# Patient Record
Sex: Male | Born: 1985 | Race: White | Hispanic: No | Marital: Single | Smoking: Current every day smoker
Health system: Southern US, Community
[De-identification: ages and names within clinical notes are randomized; demographics above are authoritative.]

## PROBLEM LIST (undated history)

## (undated) DIAGNOSIS — Z8614 Personal history of Methicillin resistant Staphylococcus aureus infection: Secondary | ICD-10-CM

## (undated) DIAGNOSIS — S02609A Fracture of mandible, unspecified, initial encounter for closed fracture: Secondary | ICD-10-CM

## (undated) HISTORY — PX: INTUSSUSCEPTION REPAIR: SHX1847

## (undated) HISTORY — PX: ORIF SHOULDER FRACTURE: SHX5035

---

## 2016-08-27 ENCOUNTER — Emergency Department (HOSPITAL_COMMUNITY): Payer: BLUE CROSS/BLUE SHIELD | Admitting: Anesthesiology

## 2016-08-27 ENCOUNTER — Encounter (HOSPITAL_COMMUNITY): Payer: Self-pay | Admitting: Emergency Medicine

## 2016-08-27 ENCOUNTER — Encounter (HOSPITAL_COMMUNITY)
Admission: EM | Disposition: A | Discharge status: Home / Self Care | Payer: Self-pay | Source: Home / Self Care | Attending: Emergency Medicine

## 2016-08-27 ENCOUNTER — Emergency Department (HOSPITAL_COMMUNITY): Payer: BLUE CROSS/BLUE SHIELD

## 2016-08-27 ENCOUNTER — Ambulatory Visit (HOSPITAL_COMMUNITY)
Admission: EM | Admit: 2016-08-27 | Discharge: 2016-08-28 | Disposition: A | Discharge status: Home / Self Care | Payer: BLUE CROSS/BLUE SHIELD | Attending: Emergency Medicine | Admitting: Emergency Medicine

## 2016-08-27 DIAGNOSIS — K029 Dental caries, unspecified: Secondary | ICD-10-CM | POA: Insufficient documentation

## 2016-08-27 DIAGNOSIS — K056 Periodontal disease, unspecified: Secondary | ICD-10-CM | POA: Insufficient documentation

## 2016-08-27 DIAGNOSIS — J342 Deviated nasal septum: Secondary | ICD-10-CM | POA: Diagnosis not present

## 2016-08-27 DIAGNOSIS — S0181XA Laceration without foreign body of other part of head, initial encounter: Secondary | ICD-10-CM | POA: Diagnosis present

## 2016-08-27 DIAGNOSIS — R55 Syncope and collapse: Secondary | ICD-10-CM | POA: Diagnosis present

## 2016-08-27 DIAGNOSIS — F17210 Nicotine dependence, cigarettes, uncomplicated: Secondary | ICD-10-CM | POA: Insufficient documentation

## 2016-08-27 DIAGNOSIS — S02611A Fracture of condylar process of right mandible, initial encounter for closed fracture: Secondary | ICD-10-CM | POA: Insufficient documentation

## 2016-08-27 DIAGNOSIS — S02621A Fracture of subcondylar process of right mandible, initial encounter for closed fracture: Secondary | ICD-10-CM | POA: Diagnosis not present

## 2016-08-27 DIAGNOSIS — Z9889 Other specified postprocedural states: Secondary | ICD-10-CM | POA: Diagnosis not present

## 2016-08-27 DIAGNOSIS — W19XXXA Unspecified fall, initial encounter: Secondary | ICD-10-CM | POA: Diagnosis not present

## 2016-08-27 DIAGNOSIS — S02609A Fracture of mandible, unspecified, initial encounter for closed fracture: Secondary | ICD-10-CM

## 2016-08-27 HISTORY — DX: Fracture of mandible, unspecified, initial encounter for closed fracture: S02.609A

## 2016-08-27 HISTORY — PX: CLOSED REDUCTION MANDIBLE: SHX5307

## 2016-08-27 LAB — URINALYSIS, ROUTINE W REFLEX MICROSCOPIC
Bilirubin Urine: NEGATIVE
Glucose, UA: NEGATIVE mg/dL
HGB URINE DIPSTICK: NEGATIVE
Ketones, ur: 20 mg/dL — AB
Leukocytes, UA: NEGATIVE
Nitrite: NEGATIVE
PROTEIN: 30 mg/dL — AB
SPECIFIC GRAVITY, URINE: 1.02 (ref 1.005–1.030)
SQUAMOUS EPITHELIAL / LPF: NONE SEEN
pH: 5 (ref 5.0–8.0)

## 2016-08-27 LAB — BASIC METABOLIC PANEL
Anion gap: 6 (ref 5–15)
BUN: 12 mg/dL (ref 6–20)
CALCIUM: 9.1 mg/dL (ref 8.9–10.3)
CHLORIDE: 102 mmol/L (ref 101–111)
CO2: 27 mmol/L (ref 22–32)
CREATININE: 0.89 mg/dL (ref 0.61–1.24)
GFR calc non Af Amer: >60 mL/min (ref >60)
GLUCOSE: 92 mg/dL (ref 65–99)
Potassium: 4.1 mmol/L (ref 3.5–5.1)
Sodium: 135 mmol/L (ref 135–145)

## 2016-08-27 LAB — CBC
HCT: 41.7 % (ref 39.0–52.0)
Hemoglobin: 13.8 g/dL (ref 13.0–17.0)
MCH: 28 pg (ref 26.0–34.0)
MCHC: 33.1 g/dL (ref 30.0–36.0)
MCV: 84.8 fL (ref 78.0–100.0)
PLATELETS: 308 10*3/uL (ref 150–400)
RBC: 4.92 MIL/uL (ref 4.22–5.81)
RDW: 12.9 % (ref 11.5–15.5)
WBC: 12.5 10*3/uL — ABNORMAL HIGH (ref 4.0–10.5)

## 2016-08-27 LAB — CBG MONITORING, ED: GLUCOSE-CAPILLARY: 97 mg/dL (ref 65–99)

## 2016-08-27 SURGERY — CLOSED REDUCTION, MANDIBLE
Anesthesia: General | Site: Mouth | Laterality: Bilateral

## 2016-08-27 MED ORDER — LACTATED RINGERS IV SOLN
INTRAVENOUS | Status: DC | PRN
  Administered 2016-08-27 – 2016-08-28 (×2): via INTRAVENOUS

## 2016-08-27 MED ORDER — ONDANSETRON HCL 4 MG/2ML IJ SOLN
INTRAMUSCULAR | Status: DC | PRN
  Administered 2016-08-27: 4 mg via INTRAVENOUS

## 2016-08-27 MED ORDER — LIDOCAINE-EPINEPHRINE (PF) 2 %-1:200000 IJ SOLN
10.0000 mL | Freq: Once | INTRAMUSCULAR | Status: AC
  Administered 2016-08-27: 10 mL
  Filled 2016-08-27: qty 10
  Filled 2016-08-27: qty 20

## 2016-08-27 MED ORDER — CLINDAMYCIN PHOSPHATE 900 MG/50ML IV SOLN
INTRAVENOUS | Status: DC | PRN
  Administered 2016-08-27: 900 mg via INTRAVENOUS

## 2016-08-27 MED ORDER — HYDROCODONE-ACETAMINOPHEN 5-325 MG PO TABS
1.0000 | ORAL_TABLET | Freq: Once | ORAL | Status: AC
  Administered 2016-08-27: 1 via ORAL
  Filled 2016-08-27: qty 1

## 2016-08-27 MED ORDER — SUFENTANIL CITRATE 50 MCG/ML IV SOLN
INTRAVENOUS | Status: DC | PRN
  Administered 2016-08-27 – 2016-08-28 (×3): 10 ug via INTRAVENOUS

## 2016-08-27 MED ORDER — MIDAZOLAM HCL 2 MG/2ML IJ SOLN
INTRAMUSCULAR | Status: DC | PRN
  Administered 2016-08-27: 2 mg via INTRAVENOUS

## 2016-08-27 MED ORDER — OXYMETAZOLINE HCL 0.05 % NA SOLN
NASAL | Status: AC
  Filled 2016-08-27: qty 15

## 2016-08-27 MED ORDER — ACETAMINOPHEN 325 MG PO TABS
650.0000 mg | ORAL_TABLET | Freq: Once | ORAL | Status: AC
  Administered 2016-08-27: 650 mg via ORAL
  Filled 2016-08-27: qty 2

## 2016-08-27 SURGICAL SUPPLY — 28 items
BLADE SURG 15 STRL LF DISP TIS (BLADE) IMPLANT
BLADE SURG 15 STRL SS (BLADE)
CANISTER SUCTION 2500CC (MISCELLANEOUS) ×3 IMPLANT
CLEANER TIP ELECTROSURG 2X2 (MISCELLANEOUS) ×1 IMPLANT
DRAPE PROXIMA HALF (DRAPES) IMPLANT
ELECT COATED BLADE 2.86 ST (ELECTRODE) ×2 IMPLANT
ELECT NDL BLADE 2-5/6 (NEEDLE) IMPLANT
ELECT NEEDLE BLADE 2-5/6 (NEEDLE) IMPLANT
ELECT REM PT RETURN 9FT ADLT (ELECTROSURGICAL) ×3
ELECTRODE REM PT RTRN 9FT ADLT (ELECTROSURGICAL) ×1 IMPLANT
GLOVE ECLIPSE 7.5 STRL STRAW (GLOVE) ×3 IMPLANT
GOWN STRL REUS W/ TWL LRG LVL3 (GOWN DISPOSABLE) ×2 IMPLANT
GOWN STRL REUS W/TWL LRG LVL3 (GOWN DISPOSABLE) ×6
KIT BASIN OR (CUSTOM PROCEDURE TRAY) ×3 IMPLANT
KIT ROOM TURNOVER OR (KITS) ×3 IMPLANT
NDL HYPO 25GX1X1/2 BEV (NEEDLE) IMPLANT
NEEDLE HYPO 25GX1X1/2 BEV (NEEDLE) ×3 IMPLANT
NS IRRIG 1000ML POUR BTL (IV SOLUTION) ×3 IMPLANT
PAD ARMBOARD 7.5X6 YLW CONV (MISCELLANEOUS) ×6 IMPLANT
PENCIL BUTTON HOLSTER BLD 10FT (ELECTRODE) ×3 IMPLANT
SCISSORS WIRE ANG 4 3/4 DISP (INSTRUMENTS) ×2 IMPLANT
SCREW UPPER FACE 2.0X8MM (Screw) ×8 IMPLANT
SUT STEEL 2 (SUTURE) ×2 IMPLANT
SUT VIC AB 3-0 FS2 27 (SUTURE) IMPLANT
TOOTHBRUSH ADULT (PERSONAL CARE ITEMS) ×3 IMPLANT
TOWEL OR 17X24 6PK STRL BLUE (TOWEL DISPOSABLE) ×3 IMPLANT
TRAY ENT MC OR (CUSTOM PROCEDURE TRAY) ×3 IMPLANT
WATER STERILE IRR 1000ML POUR (IV SOLUTION) IMPLANT

## 2016-08-27 NOTE — ED Notes (Signed)
PA notified of pt injuries from fall.

## 2016-08-27 NOTE — ED Provider Notes (Signed)
[facility]-EMERGENCY DEPT Provider Note   CSN: [id number] Arrival date & time: 08/27/16  1726  By signing my name below, I, Elder NegusRussell Johnston, attest that this documentation has been prepared under the direction and in the presence of Renne CriglerJoshua Melaney Tellefsen, [place]. Electronically Signed: Elder Negusussell Johnston, Scribe. 08/27/16. 8:31 PM.   History   Chief Complaint Chief Complaint  Patient presents with  . Loss of Consciousness    HPI Anthony Gates is a 31 y.o. male who presents to the ED for evaluation following a syncopal episode occurring at about 5PM. The patient states that he was in his usual health, "jumping at a trampoline park" for approximately 1 hour. He developed mild nausea, abdominal pain and entered a bathroom at the park. He became "real warm" and syncopized. He woke on the floor of the bathroom with a small laceration to his L chin. He also complains of R jaw pain and difficulty opening his mouth. Notes no oral intake since 11am. He denies any chest pain today. Denies any other sources of pain currently including the extremities, head, back pain, or abdomen. When asked, the patient does report a dry cough with sinus congestion 2 weeks ago that resolved. No family history of cardiac problems or arrhythmias at a young age. He has no other complaints at this time. He denies any rectal bleeding, hematuria, or other sources of bleeding.   The history is provided by the patient. No language interpreter was used.    History reviewed. No pertinent past medical history.  There are no active problems to display for this patient.   Past Surgical History:  Procedure Laterality Date  . SHOULDER SURGERY Left   . SMALL INTESTINE SURGERY     performed in childhood, unsure of surgery       Home Medications    Prior to Admission medications   Not on File    Family History History reviewed. No pertinent family history.  Social History Social History  Substance Use Topics  . Smoking status:  Current Every Day Smoker    Packs/day: 1.00    Types: Cigarettes  . Smokeless tobacco: Never Used  . Alcohol use No     Comment: occasional     Allergies   Patient has no known allergies.   Review of Systems Review of Systems  Constitutional: Negative for fever.  HENT: Positive for congestion and facial swelling. Negative for rhinorrhea and sore throat.   Eyes: Negative for redness.  Respiratory: Negative for cough.   Cardiovascular: Negative for chest pain.  Gastrointestinal: Negative for abdominal pain, blood in stool, diarrhea, nausea and vomiting.  Genitourinary: Negative for dysuria and hematuria.  Musculoskeletal: Negative for back pain and myalgias.  Skin: Positive for wound. Negative for rash.  Neurological: Positive for syncope. Negative for headaches.  All other systems reviewed and are negative.    Physical Exam Updated Vital Signs BP 112/85 (BP Location: Left Arm)   Pulse 88   Temp 97.8 F (36.6 C) (Oral)   Resp 20   SpO2 99%   Physical Exam  Constitutional: He is oriented to person, place, and time. He appears well-developed and well-nourished.  HENT:  Head: Normocephalic. Head is without raccoon's eyes and without Battle's sign.  Right Ear: Tympanic membrane, external ear and ear canal normal. No hemotympanum.  Left Ear: Tympanic membrane, external ear and ear canal normal. No hemotympanum.  Nose: Nose normal. No nasal septal hematoma.  Mouth/Throat: Oropharynx is clear and moist.  Patient with point tenderness over  the right TMJ. He has difficulty opening his jaw for the 2-3 cm. No obvious malocclusion, however subjective per patient. 2 cm chin laceration noted, hemostatic. Base explored. No foreign bodies visualized. Wound appears clean.  Eyes: Conjunctivae, EOM and lids are normal. Pupils are equal, round, and reactive to light. Right eye exhibits no discharge. Left eye exhibits no discharge.  No visible hyphema  Neck: Normal range of motion. Neck  supple.  Cardiovascular: Normal rate, regular rhythm and normal heart sounds.   Pulmonary/Chest: Effort normal and breath sounds normal.  Abdominal: Soft. There is no tenderness.  Musculoskeletal: Normal range of motion.       Cervical back: He exhibits normal range of motion, no tenderness and no bony tenderness.       Thoracic back: He exhibits no tenderness and no bony tenderness.       Lumbar back: He exhibits no tenderness and no bony tenderness.  Neurological: He is alert and oriented to person, place, and time. He has normal strength and normal reflexes. No cranial nerve deficit or sensory deficit. Coordination normal. GCS eye subscore is 4. GCS verbal subscore is 5. GCS motor subscore is 6.  Skin: Skin is warm and dry.  Psychiatric: He has a normal mood and affect.  Nursing note and vitals reviewed.    ED Treatments / Results  DIAGNOSTIC STUDIES: Oxygen Saturation is 99 percent on room air which is normal by my interpretation.    COORDINATION OF CARE: 8:25 PM Discussed treatment plan with pt at bedside which includes a laceration repair tonight.   Labs (all labs ordered are listed, but only abnormal results are displayed) Labs Reviewed  CBC - Abnormal; Notable for the following:       Result Value   WBC 12.5 (*)    All other components within normal limits  URINALYSIS, ROUTINE W REFLEX MICROSCOPIC - Abnormal; Notable for the following:    APPearance HAZY (*)    Ketones, ur 20 (*)    Protein, ur 30 (*)    Bacteria, UA RARE (*)    All other components within normal limits  BASIC METABOLIC PANEL  CBG MONITORING, ED    Radiology Ct Maxillofacial Wo Contrast  Result Date: 08/27/2016 CLINICAL DATA:  Fall at trampoline part striking mandible. Right TMJ/mandibular condyle pain. EXAM: CT MAXILLOFACIAL WITHOUT CONTRAST TECHNIQUE: Multidetector CT imaging of the maxillofacial structures was performed. Multiplanar CT image reconstructions were also generated. A small metallic BB  was placed on the right temple in order to reliably differentiate right from left. COMPARISON:  None. FINDINGS: Osseous: Examination demonstrates a displaced comminuted fracture of the right mandibular condyles. No other facial bone fractures are identified. Multiple dental caries and evidence of mild periodontal disease. Orbits: Within normal. Sinuses: Moderate opacification over the ethmoid, sphenoid and left maxillary sinus. Minimal mucosal membrane thickening over the right maxillary sinus and mild opacification of the frontal ethmoidal recesses bilaterally. No air-fluid levels or mucoperiosteal thickening. Deviation of the nasal septum to the right. Ostiomeatal complexes are opacified. Mastoid air cells are clear. Soft tissues: Within normal. Limited intracranial: Within normal. IMPRESSION: Displaced slightly comminuted right mandibular condyle fracture. Moderate chronic sinus inflammatory disease. Multiple dental caries and periodontal disease. Electronically Signed   By: Elberta Fortis M.D.   On: 08/27/2016 21:14    Procedures Procedures (including critical care time)  Medications Ordered in ED Medications - No data to display   Initial Impression / Assessment and Plan / ED Course  I have reviewed the  triage vital signs and the nursing notes.  Pertinent labs & imaging results that were available during my care of the patient were reviewed by me and considered in my medical decision making (see chart for details).     Pt and imaging discussed with Dr. Jeraldine Loots.    Vital signs reviewed and are as follows: BP 117/71   Pulse 82   Temp 97.8 F (36.6 C) (Oral)   Resp 20   SpO2 99%   Discussed patient with Dr. Suszanne Conners. He requests that patient be transferred to [facility] emergency department. He would like to operate on the patient tonight. Patient updated. I have spoken with charge nurse and Dr. Rush Landmark who are aware patient is being transferred. He will go POV. Instructed not to eat or  drink.  LACERATION REPAIR Performed by: Davina Poke PA-S Authorized by: Carolee Rota Consent: Verbal consent obtained. Risks and benefits: risks, benefits and alternatives were discussed Consent given by: patient Patient identity confirmed: provided demographic data Prepped and Draped in normal sterile fashion Wound explored  Laceration Location: chin  Laceration Length: 2cm  No Foreign Bodies seen or palpated  Anesthesia: local infiltration  Local anesthetic: lidocaine 2% with epinephrine  Anesthetic total: 4 ml  Irrigation method: skin scrub with dermal cleanser Amount of cleaning: standard  Skin closure: 4-0 Ethilon  Number of sutures: 5  Technique: simple interrupted  Patient tolerance: Patient tolerated the procedure well with no immediate complications.   Patient transferred emergency department at [facility].   Final Clinical Impressions(s) / ED Diagnoses   Final diagnoses:  Closed fracture of right condylar process of mandible, initial encounter (HCC)  Chin laceration, initial encounter  Syncope, unspecified syncope type    New Prescriptions New Prescriptions   No medications on file  I personally performed the services described in this documentation, which was scribed in my presence. The recorded information has been reviewed and is accurate.    Renne Crigler, PA-C 08/27/16 2224    Gerhard Munch, MD 08/29/16 [phone]

## 2016-08-27 NOTE — ED Notes (Signed)
Bed: [facility] Expected date:  Expected time:  Means of arrival:  Comments: No bed.  

## 2016-08-27 NOTE — ED Triage Notes (Signed)
Pt jumping on trampolines in trampoline park today, got overheated, had syncopal episode in bathroom, woke up with bleeding chin. No recent hx of syncope.  C/o cough, nasal congestion with yellow mucus. No emesis, diarrhea, fevers, chills. Had similar infectious symptoms 2 weeks ago that resolved. Left lung end expiratory wheeze not cleared with coughing.

## 2016-08-27 NOTE — Anesthesia Preprocedure Evaluation (Addendum)
Anesthesia Evaluation  Patient identified by MRN, date of birth, ID band Patient awake    Reviewed: Allergy & Precautions, H&P , NPO status , Patient's Chart, lab work & pertinent test results  Airway Mallampati: IV  TM Distance: >3 FB Neck ROM: Full  Mouth opening: Limited Mouth Opening  Dental no notable dental hx. (+) Teeth Intact, Dental Advisory Given   Pulmonary Current Smoker,    Pulmonary exam normal breath sounds clear to auscultation       Cardiovascular negative cardio ROS   Rhythm:Regular Rate:Normal     Neuro/Psych negative neurological ROS  negative psych ROS   GI/Hepatic negative GI ROS, Neg liver ROS,   Endo/Other  negative endocrine ROS  Renal/GU negative Renal ROS  negative genitourinary   Musculoskeletal   Abdominal   Peds  Hematology negative hematology ROS (+)   Anesthesia Other Findings   Reproductive/Obstetrics negative OB ROS                            Anesthesia Physical Anesthesia Plan  ASA: II  Anesthesia Plan: General   Post-op Pain Management:    Induction: Intravenous  Airway Management Planned: Oral ETT and Nasal ETT  Additional Equipment:   Intra-op Plan:   Post-operative Plan: Extubation in OR  Informed Consent: I have reviewed the patients History and Physical, chart, labs and discussed the procedure including the risks, benefits and alternatives for the proposed anesthesia with the patient or authorized representative who has indicated his/her understanding and acceptance.   Dental advisory given  Plan Discussed with: CRNA  Anesthesia Plan Comments:         Anesthesia Quick Evaluation

## 2016-08-27 NOTE — ED Triage Notes (Signed)
On reassessment of pt 3cm laceration noted to left chin. Pt stated it occurred at time of fall. Pt reports pain in lower jaw.

## 2016-08-27 NOTE — ED Notes (Signed)
PA at bedside.

## 2016-08-27 NOTE — H&P (Signed)
Reason for Consult: Right mandibular fracture  HPI:  Anthony Gates is an 31 y.o. male who presents to the [place] ER this evening c/o right jaw pain after a syncopal episode. The patient states that he was in his usual health, "jumping at a trampoline park" for approximately 1 hour. He developed mild nausea, abdominal pain and entered a bathroom at the park. He became "real warm" and passed out. He woke up on the floor of the bathroom with a small laceration to his L chin. He also complains of R jaw pain and difficulty opening his mouth. Notes no oral intake since 11am. He denies any chest pain today. Denies any other sources of pain currently including the extremities, head, back pain, or abdomen. When asked, the patient does report a dry cough with sinus congestion 2 weeks ago that resolved. No family history of cardiac problems or arrhythmias at a young age. He has no other complaints at this time. He denies any rectal bleeding, hematuria, or other sources of bleeding. His CT scan shows a right mandibular condyle fracture.  History reviewed. No pertinent past medical history.  Past Surgical History:  Procedure Laterality Date  . SHOULDER SURGERY Left   . SMALL INTESTINE SURGERY     performed in childhood, unsure of surgery    History reviewed. No pertinent family history.  Social History:  reports that he has been smoking Cigarettes.  He has been smoking about 1.00 pack per day. He has never used smokeless tobacco. He reports that he does not drink alcohol or use drugs.  Allergies: No Known Allergies  Prior to Admission medications   Not on File    Results for orders placed or performed during the hospital encounter of 08/27/16 (from the past 48 hour(s))  Basic metabolic panel     Status: None   Collection Time: 08/27/16  6:24 PM  Result Value Ref Range   Sodium 135 135 - 145 mmol/L   Potassium 4.1 3.5 - 5.1 mmol/L   Chloride 102 101 - 111 mmol/L   CO2 27 22 - 32 mmol/L   Glucose, Bld 92 65 - 99 mg/dL   BUN 12 6 - 20 mg/dL   Creatinine, Ser 0.89 0.61 - 1.24 mg/dL   Calcium 9.1 8.9 - 10.3 mg/dL   GFR calc non Af Amer >60 >60 mL/min   GFR calc Af Amer >60 >60 mL/min    Comment: (NOTE) The eGFR has been calculated using the CKD EPI equation. This calculation has not been validated in all clinical situations. eGFR's persistently <60 mL/min signify possible Chronic Kidney Disease.    Anion gap 6 5 - 15  CBC     Status: Abnormal   Collection Time: 08/27/16  6:24 PM  Result Value Ref Range   WBC 12.5 (H) 4.0 - 10.5 K/uL   RBC 4.92 4.22 - 5.81 MIL/uL   Hemoglobin 13.8 13.0 - 17.0 g/dL   HCT 41.7 39.0 - 52.0 %   MCV 84.8 78.0 - 100.0 fL   MCH 28.0 26.0 - 34.0 pg   MCHC 33.1 30.0 - 36.0 g/dL   RDW 12.9 11.5 - 15.5 %   Platelets 308 150 - 400 K/uL  CBG monitoring, ED     Status: None   Collection Time: 08/27/16  6:24 PM  Result Value Ref Range   Glucose-Capillary 97 65 - 99 mg/dL  Urinalysis, Routine w reflex microscopic     Status: Abnormal   Collection Time: 08/27/16  8:09  PM  Result Value Ref Range   Color, Urine YELLOW YELLOW   APPearance HAZY (A) CLEAR   Specific Gravity, Urine 1.020 1.005 - 1.030   pH 5.0 5.0 - 8.0   Glucose, UA NEGATIVE NEGATIVE mg/dL   Hgb urine dipstick NEGATIVE NEGATIVE   Bilirubin Urine NEGATIVE NEGATIVE   Ketones, ur 20 (A) NEGATIVE mg/dL   Protein, ur 30 (A) NEGATIVE mg/dL   Nitrite NEGATIVE NEGATIVE   Leukocytes, UA NEGATIVE NEGATIVE   RBC / HPF 0-5 0 - 5 RBC/hpf   WBC, UA 6-30 0 - 5 WBC/hpf   Bacteria, UA RARE (A) NONE SEEN   Squamous Epithelial / LPF NONE SEEN NONE SEEN   Mucous PRESENT    Hyaline Casts, UA PRESENT    Granular Casts, UA PRESENT     Ct Maxillofacial Wo Contrast  Result Date: 08/27/2016 CLINICAL DATA:  Fall at trampoline part striking mandible. Right TMJ/mandibular condyle pain. EXAM: CT MAXILLOFACIAL WITHOUT CONTRAST TECHNIQUE: Multidetector CT imaging of the maxillofacial structures was  performed. Multiplanar CT image reconstructions were also generated. A small metallic BB was placed on the right temple in order to reliably differentiate right from left. COMPARISON:  None. FINDINGS: Osseous: Examination demonstrates a displaced comminuted fracture of the right mandibular condyles. No other facial bone fractures are identified. Multiple dental caries and evidence of mild periodontal disease. Orbits: Within normal. Sinuses: Moderate opacification over the ethmoid, sphenoid and left maxillary sinus. Minimal mucosal membrane thickening over the right maxillary sinus and mild opacification of the frontal ethmoidal recesses bilaterally. No air-fluid levels or mucoperiosteal thickening. Deviation of the nasal septum to the right. Ostiomeatal complexes are opacified. Mastoid air cells are clear. Soft tissues: Within normal. Limited intracranial: Within normal. IMPRESSION: Displaced slightly comminuted right mandibular condyle fracture. Moderate chronic sinus inflammatory disease. Multiple dental caries and periodontal disease. Electronically Signed   By: Marin Olp M.D.   On: 08/27/2016 21:14   Review of Systems  Constitutional: Negative for fever.  HENT: Positive for congestion and facial swelling. Negative for rhinorrhea and sore throat.   Eyes: Negative for redness.  Respiratory: Negative for cough.   Cardiovascular: Negative for chest pain.  Gastrointestinal: Negative for abdominal pain, blood in stool, diarrhea, nausea and vomiting.  Genitourinary: Negative for dysuria and hematuria.  Musculoskeletal: Negative for back pain and myalgias.  Skin: Positive for wound. Negative for rash.  Neurological: Positive for syncope. Negative for headaches.  All other systems reviewed and are negative.  Blood pressure 117/78, pulse 82, temperature 97.8 F (36.6 C), temperature source Oral, resp. rate 20, SpO2 97 %. Physical Exam  Constitutional: He is oriented to person, place, and time. He  appears well-developed and well-nourished.  Head: Normocephalic. Head is without raccoon's eyes and without Battle's sign.  Right Ear: Tympanic membrane, external ear and ear canal normal.  Left Ear: Tympanic membrane, external ear and ear canal normal.   Nose: Nose normal. No nasal septal hematoma.  Mouth/Throat: Oropharynx is clear and moist.  Patient with point tenderness over the right TMJ. He has difficulty opening his jaw for the 2-3 cm. No obvious malocclusion. 2 cm chin laceration noted, hemostatic. Eyes: Conjunctivae, EOM and lids are normal. Pupils are equal, round, and reactive to light. Right eye exhibits no discharge. Left eye exhibits no discharge.  Neck: Normal range of motion. Neck supple.  Cardiovascular: Normal rate, regular rhythm and normal heart sounds.   Pulmonary/Chest: Effort normal and breath sounds normal.  Abdominal: Soft. There is no tenderness.  Musculoskeletal: Normal range of motion.  Neurological: He is alert and oriented to person, place, and time. He has normal strength and normal reflexes. No cranial nerve deficit or sensory deficit. Coordination normal.  Skin: Skin is warm and dry.  Psychiatric: He has a normal mood and affect.  Nursing note and vitals reviewed.  Assessment/Plan: Right mandibular condyle fracture, slightly comminuted. Plan MMF in OR. The risks, benefits, alternatives, and details of the procedure are reviewed with the patient and his wife. Questions are invited and answered. Informed consent is obtained.  Quentez Lober,SUI W 08/27/2016, 11:52 PM

## 2016-08-28 ENCOUNTER — Encounter (HOSPITAL_COMMUNITY): Payer: Self-pay | Admitting: Otolaryngology

## 2016-08-28 MED ORDER — LIDOCAINE-EPINEPHRINE (PF) 1 %-1:200000 IJ SOLN
INTRAMUSCULAR | Status: AC
  Filled 2016-08-28: qty 30

## 2016-08-28 MED ORDER — PENICILLIN V POTASSIUM 250 MG/5ML PO SOLR: 500.0000 mg | Freq: Two times a day (BID) | ORAL | 0 refills | Status: AC

## 2016-08-28 MED ORDER — KETAMINE HCL-SODIUM CHLORIDE 20-0.9 MG/2ML-% IV SOSY
20.0000 mg | PREFILLED_SYRINGE | Freq: Once | INTRAVENOUS | Status: AC
  Administered 2016-08-28: 20 mg via INTRAVENOUS
  Filled 2016-08-28: qty 2

## 2016-08-28 MED ORDER — CHLORHEXIDINE GLUCONATE 0.12 % MT SOLN: OROMUCOSAL | 0 refills | Status: DC

## 2016-08-28 MED ORDER — KETOROLAC TROMETHAMINE 30 MG/ML IJ SOLN
INTRAMUSCULAR | Status: AC
  Administered 2016-08-28: 30 mg via INTRAVENOUS
  Filled 2016-08-28: qty 1

## 2016-08-28 MED ORDER — 0.9 % SODIUM CHLORIDE (POUR BTL) OPTIME
TOPICAL | Status: DC | PRN
  Administered 2016-08-28: 1000 mL

## 2016-08-28 MED ORDER — HYDROMORPHONE HCL 1 MG/ML IJ SOLN
0.2500 mg | INTRAMUSCULAR | Status: DC | PRN
  Administered 2016-08-28 (×4): 0.5 mg via INTRAVENOUS
  Filled 2016-08-28 (×4): qty 0.5

## 2016-08-28 MED ORDER — DEXAMETHASONE SODIUM PHOSPHATE 10 MG/ML IJ SOLN
INTRAMUSCULAR | Status: DC | PRN
  Administered 2016-08-28: 10 mg via INTRAVENOUS

## 2016-08-28 MED ORDER — KETAMINE HCL-SODIUM CHLORIDE 20-0.9 MG/2ML-% IV SOSY
10.0000 mg | PREFILLED_SYRINGE | Freq: Once | INTRAVENOUS | Status: AC
  Administered 2016-08-28: 10 mg via INTRAVENOUS

## 2016-08-28 MED ORDER — LIDOCAINE-EPINEPHRINE (PF) 1 %-1:200000 IJ SOLN
INTRAMUSCULAR | Status: DC | PRN
  Administered 2016-08-28: 2 mL

## 2016-08-28 MED ORDER — HYDROMORPHONE HCL 2 MG/ML IJ SOLN
INTRAMUSCULAR | Status: AC
  Filled 2016-08-28: qty 1

## 2016-08-28 MED ORDER — KETOROLAC TROMETHAMINE 30 MG/ML IJ SOLN
30.0000 mg | Freq: Once | INTRAMUSCULAR | Status: AC
  Administered 2016-08-28: 30 mg via INTRAVENOUS

## 2016-08-28 MED ORDER — OXYCODONE HCL 5 MG/5ML PO SOLN: 5.0000 mg | Freq: Four times a day (QID) | ORAL | 0 refills | Status: DC | PRN

## 2016-08-28 NOTE — Anesthesia Procedure Notes (Signed)
Procedure Name: Intubation Date/Time: 08/28/2016 12:04 AM Performed by: Brack Shaddock S Pre-anesthesia Checklist: Patient identified, Emergency Drugs available, Suction available, Patient being monitored and Timeout performed Patient Re-evaluated:Patient Re-evaluated prior to inductionOxygen Delivery Method: Circle system utilized Preoxygenation: Pre-oxygenation with 100% oxygen Intubation Type: IV induction Ventilation: Mask ventilation without difficulty Laryngoscope Size: Glidescope (planned) Tube type: nasal rae  Nasal Tubes: Nasal prep performed and Nasal Rae Tube size: 7.0 mm Number of attempts: 1 Airway Equipment and Method: Video-laryngoscopy Placement Confirmation: ETT inserted through vocal cords under direct vision,  positive ETCO2 and breath sounds checked- equal and bilateral Secured at: 22 cm Tube secured with: Tape Dental Injury: Teeth and Oropharynx as per pre-operative assessment

## 2016-08-28 NOTE — Op Note (Signed)
DATE OF PROCEDURE:  08/28/2016                              OPERATIVE REPORT  SURGEON:  Newman PiesSu Franz Svec, MD  PREOPERATIVE DIAGNOSES: 1. Right mandibular condyle fracture.  POSTOPERATIVE DIAGNOSES: 1. Right mandibular condyle fracture.  PROCEDURE PERFORMED:  Closed reduction of mandibular fractures with mandibular maxillary fixation.  ANESTHESIA:  General endotracheal tube anesthesia.  COMPLICATIONS:  None.  ESTIMATED BLOOD LOSS:  Minimal.  INDICATION FOR PROCEDURE:  Anthony SeverinDavid A Gugel is a 31 y.o. male who had a syncope episode yesterday evening. He fell and hit his jaw on the floor. It resulted in a slightly comminuted right mandibular condyle fracture. Based on the above findings, the decision was made for the patient to undergo the mandibulomaxillary fixation procedure.  The risks, benefits, alternatives, and details of the procedure were discussed with the patient and his wife.  Questions were invited and answered.  Informed consent was obtained.  DESCRIPTION:  The patient was taken to the operating room and placed supine on the operating table.  General endotracheal tube anesthesia was administered transnasally  by the anesthesiologist.  The patient was positioned and prepped and draped in a standard fashion for oral surgery. 1% lidocaine with 1-200,000 epinephrine was infiltrated at the planned site of the MMF screws.  Four 8 mm MMF screws were placed in the standard fashion. Reduction of the mandibular fracture and mandibulomaxillary fixation were achieved using 24-gauge wires. Good occlusion was achieved. The care of the patient was turned over to the anesthesiologist.  The patient was awakened from anesthesia without difficulty.  The patient was extubated and transferred to the recovery room in good condition.  OPERATIVE FINDINGS: Right mandibular condyle fracture.   SPECIMEN:  None  FOLLOWUP CARE:  The patient will be discharged home once awake and alert. The patient will follow up in my  office in approximately 2 weeks.  Darletta MollEOH,SUI W 08/28/2016 12:32 AM

## 2016-08-28 NOTE — Progress Notes (Signed)
D; DC instruction given to Pt and wife, reminded wire cutter at beside him all the time.

## 2016-08-28 NOTE — Transfer of Care (Signed)
Immediate Anesthesia Transfer of Care Note  Patient: Anthony Gates  Procedure(s) Performed: Procedure(s): CLOSED REDUCTION MAXILLO-MANDIBULAR FIXATION WITH SCREWS AND 24 GAUGE WIRE (Bilateral)  Patient Location: PACU  Anesthesia Type:General  Level of Consciousness: awake, alert  and oriented  Airway & Oxygen Therapy: Patient Spontanous Breathing and Patient connected to face mask oxygen  Post-op Assessment: Report given to RN and Post -op Vital signs reviewed and stable  Post vital signs: Reviewed and stable  Last Vitals:  Vitals:   08/27/16 2215 08/28/16 0042  BP: 117/78   Pulse: 82 (!) 101  Resp:    Temp:      Last Pain:  Vitals:   08/27/16 2223  TempSrc:   PainSc: 4          Complications: No apparent anesthesia complications

## 2016-08-28 NOTE — Discharge Instructions (Addendum)
Fractured-Jaw Meal Plan The purpose of the fractured-jaw meal plan is to provide foods that can be easily blended and easily swallowed. This plan is typically used after jaw or mouth surgery, wired jaw surgery, or dental surgery. Foods in this plan need to be blended so that they can be sipped from a straw or given through a syringe. You should try to have at least three meals and three snacks daily. It is important to make sure you get enough calories and protein to prevent weight loss and help your body heal, especially after surgery. You may wish to include a liquid multivitamin in your plan to ensure that you get all the vitamins and minerals you need. Ask your health care provider for a recommendation. How do I prepare my meals? All foods in this plan must be blended. Avoid nuts, seeds, skins, peels, bones, or any foods that cannot be blended to the right consistency. Make sure to eat a variety of foods from each food group every day. The following tips can help you as you blend your food:  Remove skins, seeds, and peels from food.  Cook meats and vegetables thoroughly.  Cut foods into small pieces and mix with a small amount of liquid in a food processor or blender. Continue to add liquid until the food becomes thin enough to sip through a straw.       Mandibular Fracture A mandibular fracture is a break in the jawbone. CAUSES  The most common cause of mandibular fracture is a direct blow (trauma) to the jaw. This could happen from:  A car crash.  Physical violence.  A fall from a high place. SYMPTOMS   Pain.  Swelling.  Difficulty and pain when closing the mouth.  Feeling that the teeth are not aligned properly when closing the mouth (malocclusion).  Difficulty speaking.  Difficulty swallowing. DIAGNOSIS  Your caregiver will take your history and perform a physical exam. He or she may also order imaging tests, such as X-rays or a computed tomography (CT) scan, to  confirm your diagnosis. TREATMENT  Surgery is often needed to put the jaw back in the right position. Wires are usually placed around the teeth to hold the jaw in place while it heals. Treatment may also include pain medicine, ice, and a soft or liquid diet. HOME CARE INSTRUCTIONS   Put ice on the injured area.  Put ice in a plastic bag.  Place a towel between your skin and the bag.  Leave the ice on for 15-20 minutes, 03-04 times a day for the first 2 days.  Only take over-the-counter or prescription medicines for pain, discomfort, or fever as directed by your caregiver.  Eat a well-balanced, high-protein soft or liquid diet as directed by your caregiver.  If your jaws are wired, follow your caregiver's instructions for wired jaw care.  Sleep on your back to avoid putting pressure on your jaw.  Avoid exercising to the point that you become short of breath. SEEK MEDICAL CARE IF:   You have a severe headache or numbness in your face.  You have severe jaw pain that is not relieved with medicine.  Your jaw wires become loose.  You have uncontrollable nausea or anxiety.  Your swelling or redness gets worse. SEEK IMMEDIATE MEDICAL CARE IF:  You have a fever.  You have difficulty breathing.  You feel like your airway is tightening.  You cannot swallow your saliva.  You make a high-pitched whistling sound when you breathe (wheezing).  MAKE SURE YOU:   Understand these instructions.  Will watch your condition.  Will get help right away if you are not doing well or get worse. This information is not intended to replace advice given to you by your health care provider. Make sure you discuss any questions you have with your health care provider. Document Released: 07/17/2005 Document Revised: 08/07/2014 Document Reviewed: 04/11/2015 Elsevier Interactive Patient Education  2017 [employer]   Adding liquids such as juice, milk, cream, broth, gravy, or vegetable juice can  help add flavor to foods.  Heat foods after they have been blended to reduce the amount of foam created from blending.  Heat or cool your foods to lukewarm temperatures if your teeth and mouth are sensitive to extreme temperatures. What foods can I eat? Make sure to eat a variety of foods from each food group. Grains  Hot cereals, such as oatmeal, grits, ground wheat cereals, and polenta.  Rice and pasta.  Couscous. Vegetables  All cooked or canned vegetables, without seeds and skins.  Vegetable juices.  Cooked potatoes, without skins. Fruit  Any cooked or canned fruits, without seeds and skins.  Fresh, peeled soft fruits, such as bananas and peaches, that can be blended until smooth.  All fruit juices, without seeds and skins. Meat and Other Protein Sources  Soft-boiled eggs, scrambled eggs, powdered eggs, pasteurized egg mixtures, and custard.  Ground meats, such as hamburger, [place]urkey, sausage, and meatloaf.  Tender, well-cooked meat, poultry, and fish prepared without bones or skin.  Soft soy foods (such as tofu).  Smooth nut butters. Dairy  All are allowed. Beverages  Coffee (regular or decaffeinated), tea, and mineral water. Condiments  All seasonings and condiments that blend well. When may I need to supplement my meals? If you begin to lose weight on this plan, you may need to increase the amount of food you are eating or the number of calories in your food or both. You can increase the number of calories by adding any of the following foods:  Protein powder or powdered milk.  Extra fats, such as margarine (without trans fat), sour cream, cream cheese, cream, and nut butters, such as peanut butter or almond butter.  Sweets, such as honey, ice cream, blackstrap molasses, or sugar. This information is not intended to replace advice given to you by your health care provider. Make sure you discuss any questions you have with your health care  provider. Document Released: 01/04/2010 Document Revised: 12/23/2015 Document Reviewed: 06/13/2013 Elsevier Interactive Patient Education  2017 [employer]  -----------------------   Introduction __David Williams__ needs to be excused from: _x___ Work ____ [employer] ____ Physical activity beginning now and through the following date: __2/4/18__. He or she may return to work on __2/5/18_____________.   Health Care Provider Name: _____Su Philomena DohenyWooi Teoh, MD___________________________________  Date: __1/29/2018______________ This information is not intended to replace advice given to you by your health care provider. Make sure you discuss any questions you have with your health care provider. Document Released: 01/10/2001 Document Revised: 02/04/2016 Document Reviewed: 02/16/2014  2017 Elsevier     Post Anesthesia Home Care Instructions  Activity: Get plenty of rest for the remainder of the day. A responsible adult should stay with you for 24 hours following the procedure.  For the next 24 hours, DO NOT: -Drive a car -Advertising copywriterperate machinery -Drink alcoholic beverages -Take any medication unless instructed by your physician -Make any legal decisions or sign important papers.  Meals: Start with liquid foods such as  gelatin or soup. Progress to regular foods as tolerated. Avoid greasy, spicy, heavy foods. If nausea and/or vomiting occur, drink only clear liquids until the nausea and/or vomiting subsides. Call your physician if vomiting continues.  Special Instructions/Symptoms: Your throat may feel dry or sore from the anesthesia or the breathing tube placed in your throat during surgery. If this causes discomfort, gargle with warm salt water. The discomfort should disappear within 24 hours.  If you had a scopolamine patch placed behind your ear for the management of post- operative nausea and/or vomiting:  1. The medication in the patch is effective for 72 hours, after which it should  be removed.  Wrap patch in a tissue and discard in the trash. Wash hands thoroughly with soap and water. 2. You may remove the patch earlier than 72 hours if you experience unpleasant side effects which may include dry mouth, dizziness or visual disturbances. 3. Avoid touching the patch. Wash your hands with soap and water after contact with the patch.    Post Anesthesia Home Care Instructions  Activity: Get plenty of rest for the remainder of the day. A responsible adult should stay with you for 24 hours following the procedure.  For the next 24 hours, DO NOT: -Drive a car -Advertising copywriter -Drink alcoholic beverages -Take any medication unless instructed by your physician -Make any legal decisions or sign important papers.  Meals: Start with liquid foods such as gelatin or soup. Progress to regular foods as tolerated. Avoid greasy, spicy, heavy foods. If nausea and/or vomiting occur, drink only clear liquids until the nausea and/or vomiting subsides. Call your physician if vomiting continues.  Special Instructions/Symptoms: Your throat may feel dry or sore from the anesthesia or the breathing tube placed in your throat during surgery. If this causes discomfort, gargle with warm salt water. The discomfort should disappear within 24 hours.  If you had a scopolamine patch placed behind your ear for the management of post- operative nausea and/or vomiting:  1. The medication in the patch is effective for 72 hours, after which it should be removed.  Wrap patch in a tissue and discard in the trash. Wash hands thoroughly with soap and water. 2. You may remove the patch earlier than 72 hours if you experience unpleasant side effects which may include dry mouth, dizziness or visual disturbances. 3. Avoid touching the patch. Wash your hands with soap and water after contact with the patch.

## 2016-08-29 NOTE — Anesthesia Postprocedure Evaluation (Signed)
Anesthesia Post Note  Patient: Anthony Gates  Procedure(s) Performed: Procedure(s) (LRB): CLOSED REDUCTION MAXILLO-MANDIBULAR FIXATION WITH SCREWS AND 24 GAUGE WIRE (Bilateral)  Patient location during evaluation: PACU Anesthesia Type: General Level of consciousness: awake and alert Pain management: pain level controlled Vital Signs Assessment: post-procedure vital signs reviewed and stable Respiratory status: spontaneous breathing, nonlabored ventilation and respiratory function stable Cardiovascular status: blood pressure returned to baseline and stable Postop Assessment: no signs of nausea or vomiting Anesthetic complications: no       Last Vitals:  Vitals:   08/28/16 0130 08/28/16 0145  BP: 123/87 115/88  Pulse: 79 74  Resp: 10 12  Temp: 37.7 C 37.7 C    Last Pain:  Vitals:   08/28/16 0130  TempSrc:   PainSc: 4                  Yanelis Osika,W. EDMOND

## 2016-10-06 ENCOUNTER — Other Ambulatory Visit: Payer: Self-pay | Admitting: Otolaryngology

## 2016-10-06 ENCOUNTER — Encounter (HOSPITAL_BASED_OUTPATIENT_CLINIC_OR_DEPARTMENT_OTHER): Payer: Self-pay | Admitting: *Deleted

## 2016-10-10 ENCOUNTER — Ambulatory Visit (HOSPITAL_BASED_OUTPATIENT_CLINIC_OR_DEPARTMENT_OTHER): Payer: BLUE CROSS/BLUE SHIELD | Admitting: Anesthesiology

## 2016-10-10 ENCOUNTER — Encounter (HOSPITAL_BASED_OUTPATIENT_CLINIC_OR_DEPARTMENT_OTHER): Payer: Self-pay | Admitting: Anesthesiology

## 2016-10-10 ENCOUNTER — Encounter (HOSPITAL_BASED_OUTPATIENT_CLINIC_OR_DEPARTMENT_OTHER)
Admission: RE | Disposition: A | Discharge status: Home / Self Care | Payer: Self-pay | Source: Ambulatory Visit | Attending: Otolaryngology

## 2016-10-10 ENCOUNTER — Ambulatory Visit (HOSPITAL_BASED_OUTPATIENT_CLINIC_OR_DEPARTMENT_OTHER)
Admission: RE | Admit: 2016-10-10 | Discharge: 2016-10-10 | Disposition: A | Discharge status: Home / Self Care | Payer: BLUE CROSS/BLUE SHIELD | Source: Ambulatory Visit | Attending: Otolaryngology | Admitting: Otolaryngology

## 2016-10-10 DIAGNOSIS — Z79891 Long term (current) use of opiate analgesic: Secondary | ICD-10-CM | POA: Diagnosis not present

## 2016-10-10 DIAGNOSIS — Z4589 Encounter for adjustment and management of other implanted devices: Secondary | ICD-10-CM | POA: Diagnosis not present

## 2016-10-10 DIAGNOSIS — F17200 Nicotine dependence, unspecified, uncomplicated: Secondary | ICD-10-CM | POA: Diagnosis not present

## 2016-10-10 DIAGNOSIS — S02621A Fracture of subcondylar process of right mandible, initial encounter for closed fracture: Secondary | ICD-10-CM | POA: Diagnosis not present

## 2016-10-10 DIAGNOSIS — Z472 Encounter for removal of internal fixation device: Secondary | ICD-10-CM | POA: Diagnosis not present

## 2016-10-10 HISTORY — DX: Personal history of Methicillin resistant Staphylococcus aureus infection: Z86.14

## 2016-10-10 HISTORY — DX: Fracture of mandible, unspecified, initial encounter for closed fracture: S02.609A

## 2016-10-10 HISTORY — PX: MANDIBULAR HARDWARE REMOVAL: SHX5205

## 2016-10-10 SURGERY — REMOVAL, HARDWARE, MANDIBLE
Anesthesia: Monitor Anesthesia Care

## 2016-10-10 MED ORDER — SCOPOLAMINE 1 MG/3DAYS TD PT72
1.0000 | MEDICATED_PATCH | Freq: Once | TRANSDERMAL | Status: DC | PRN
Start: 2016-10-10 — End: 2016-10-10

## 2016-10-10 MED ORDER — PROMETHAZINE HCL 25 MG/ML IJ SOLN: 6.2500 mg | INTRAMUSCULAR | Status: DC | PRN

## 2016-10-10 MED ORDER — MIDAZOLAM HCL 2 MG/2ML IJ SOLN
1.0000 mg | INTRAMUSCULAR | Status: DC | PRN
  Administered 2016-10-10: 2 mg via INTRAVENOUS

## 2016-10-10 MED ORDER — LIDOCAINE 2% (20 MG/ML) 5 ML SYRINGE
INTRAMUSCULAR | Status: DC | PRN
  Administered 2016-10-10: 50 mg via INTRAVENOUS

## 2016-10-10 MED ORDER — LACTATED RINGERS IV SOLN
INTRAVENOUS | Status: DC
  Administered 2016-10-10: 11:00:00 via INTRAVENOUS

## 2016-10-10 MED ORDER — LIDOCAINE-EPINEPHRINE (PF) 1 %-1:200000 IJ SOLN
INTRAMUSCULAR | Status: DC | PRN
  Administered 2016-10-10: .5 mL

## 2016-10-10 MED ORDER — FENTANYL CITRATE (PF) 100 MCG/2ML IJ SOLN: 25.0000 ug | INTRAMUSCULAR | Status: DC | PRN

## 2016-10-10 MED ORDER — FENTANYL CITRATE (PF) 100 MCG/2ML IJ SOLN
INTRAMUSCULAR | Status: AC
  Filled 2016-10-10: qty 2

## 2016-10-10 MED ORDER — MIDAZOLAM HCL 2 MG/2ML IJ SOLN
INTRAMUSCULAR | Status: AC
Start: 2016-10-10 — End: 2016-10-10
  Filled 2016-10-10: qty 2

## 2016-10-10 MED ORDER — PROPOFOL 10 MG/ML IV BOLUS
INTRAVENOUS | Status: AC
  Filled 2016-10-10: qty 20

## 2016-10-10 MED ORDER — FENTANYL CITRATE (PF) 100 MCG/2ML IJ SOLN
50.0000 ug | INTRAMUSCULAR | Status: DC | PRN
  Administered 2016-10-10: 100 ug via INTRAVENOUS

## 2016-10-10 MED ORDER — PROPOFOL 500 MG/50ML IV EMUL
INTRAVENOUS | Status: DC | PRN
  Administered 2016-10-10: 25 ug/kg/min via INTRAVENOUS

## 2016-10-10 SURGICAL SUPPLY — 29 items
BLADE SURG 15 STRL LF DISP TIS (BLADE) ×1 IMPLANT
BLADE SURG 15 STRL SS (BLADE) ×3
CANISTER SUCT 1200ML W/VALVE (MISCELLANEOUS) ×3 IMPLANT
COVER MAYO STAND STRL (DRAPES) ×3 IMPLANT
DECANTER SPIKE VIAL GLASS SM (MISCELLANEOUS) ×3 IMPLANT
ELECT COATED BLADE 2.86 ST (ELECTRODE) ×3 IMPLANT
ELECT REM PT RETURN 9FT ADLT (ELECTROSURGICAL) ×3
ELECTRODE REM PT RTRN 9FT ADLT (ELECTROSURGICAL) ×1 IMPLANT
GAUZE SPONGE 4X4 16PLY XRAY LF (GAUZE/BANDAGES/DRESSINGS) IMPLANT
GLOVE BIO SURGEON STRL SZ7.5 (GLOVE) ×3 IMPLANT
GOWN STRL REUS W/ TWL LRG LVL3 (GOWN DISPOSABLE) ×2 IMPLANT
GOWN STRL REUS W/TWL LRG LVL3 (GOWN DISPOSABLE) ×6
MARKER SKIN DUAL TIP RULER LAB (MISCELLANEOUS) IMPLANT
NEEDLE PRECISIONGLIDE 27X1.5 (NEEDLE) ×3 IMPLANT
NS IRRIG 1000ML POUR BTL (IV SOLUTION) ×3 IMPLANT
PACK BASIN DAY SURGERY FS (CUSTOM PROCEDURE TRAY) ×3 IMPLANT
PENCIL BUTTON HOLSTER BLD 10FT (ELECTRODE) ×3 IMPLANT
SCISSORS WIRE ANG 4 3/4 DISP (INSTRUMENTS) IMPLANT
SHEET MEDIUM DRAPE 40X70 STRL (DRAPES) ×3 IMPLANT
SPONGE GAUZE 4X4 12PLY STER LF (GAUZE/BANDAGES/DRESSINGS) ×6 IMPLANT
SUT CHROMIC 3 0 PS 2 (SUTURE) IMPLANT
SUT CHROMIC 4 0 PS 2 18 (SUTURE) IMPLANT
SUT CHROMIC 4 0 RB 1X27 (SUTURE) IMPLANT
SYR CONTROL 10ML LL (SYRINGE) ×3 IMPLANT
TOWEL OR 17X24 6PK STRL BLUE (TOWEL DISPOSABLE) ×3 IMPLANT
TRAY DSU PREP LF (CUSTOM PROCEDURE TRAY) IMPLANT
TUBE CONNECTING 20'X1/4 (TUBING) ×1
TUBE CONNECTING 20X1/4 (TUBING) ×2 IMPLANT
YANKAUER SUCT BULB TIP NO VENT (SUCTIONS) IMPLANT

## 2016-10-10 NOTE — Op Note (Signed)
DATE OF PROCEDURE:  10/10/2016                              OPERATIVE REPORT  SURGEON:  Newman PiesSu Aiyannah Fayad, MD  PREOPERATIVE DIAGNOSES: 1. Mandibular fracture, status post mandibulomaxillary fixation  POSTOPERATIVE DIAGNOSES: 1. Mandibular fracture, status post mandibulomaxillary fixation  PROCEDURE PERFORMED:  Removal of mandibulomaxillary fixation screws  ANESTHESIA:  IV sedation with local anesthesia  COMPLICATIONS:  None.  ESTIMATED BLOOD LOSS:  Minimal.  INDICATION FOR PROCEDURE:   Anthony Gates is a 31 y.o. male who had an accidental fall 6 weeks ago, resulting in a right mandibular fracture. The patient was treated with mandibulomaxillary fixation. The patient returns today for removal of his mandibular maxillary fixation hardware. The risks, benefits, alternatives, and details of the procedure were discussed with the patient.  Questions were invited and answered.  Informed consent was obtained.  DESCRIPTION:  The patient was taken to the operating room and placed supine on the operating table. IV sedation was administered by the anesthesiologist. 1% lidocaine with 1-100,000 epinephrine was infiltrated around the 4 mandibulomaxillary fixation screws. The mucosa overlying the mandibulomaxillary fixation screws was incised with a #15 blade. All 4 screws were subsequently exposed with a freer elevator.  All 4 screws were removed without difficulty.   The care of the patient was turned over to the anesthesiologist.  The patient was awakened from sedation without difficulty.  The patient was transferred to the recovery room in good condition.  OPERATIVE FINDINGS:  The mandibulomaxillary fixation screws and wires were removed without difficulty.  SPECIMEN:  None.  FOLLOWUP CARE:  The patient will be discharged home once he is awake and alert.  Anthony Gates 10/10/2016

## 2016-10-10 NOTE — Discharge Instructions (Addendum)
The patient may resume all his previous activities and regular diet.  Call your surgeon if you experience:   1.  Fever over 101.0. 2.  Inability to urinate. 3.  Nausea and/or vomiting. 4.  Extreme swelling or bruising at the surgical site. 5.  Continued bleeding from the incision. 6.  Increased pain, redness or drainage from the incision. 7.  Problems related to your pain medication. 8.  Any problems and/or concerns  Post Anesthesia Home Care Instructions  Activity: Get plenty of rest for the remainder of the day. A responsible adult should stay with you for 24 hours following the procedure.  For the next 24 hours, DO NOT: -Drive a car -Advertising copywriterperate machinery -Drink alcoholic beverages -Take any medication unless instructed by your physician -Make any legal decisions or sign important papers.  Meals: Start with liquid foods such as gelatin or soup. Progress to regular foods as tolerated. Avoid greasy, spicy, heavy foods. If nausea and/or vomiting occur, drink only clear liquids until the nausea and/or vomiting subsides. Call your physician if vomiting continues.  Special Instructions/Symptoms: Your throat may feel dry or sore from the anesthesia or the breathing tube placed in your throat during surgery. If this causes discomfort, gargle with warm salt water. The discomfort should disappear within 24 hours.  If you had a scopolamine patch placed behind your ear for the management of post- operative nausea and/or vomiting:  1. The medication in the patch is effective for 72 hours, after which it should be removed.  Wrap patch in a tissue and discard in the trash. Wash hands thoroughly with soap and water. 2. You may remove the patch earlier than 72 hours if you experience unpleasant side effects which may include dry mouth, dizziness or visual disturbances. 3. Avoid touching the patch. Wash your hands with soap and water after contact with the patch.

## 2016-10-10 NOTE — Transfer of Care (Signed)
Immediate Anesthesia Transfer of Care Note  Patient: Anthony Gates  Procedure(s) Performed: Procedure(s): MANDIBULAR HARDWARE REMOVAL (N/A)  Patient Location: PACU  Anesthesia Type:General  Level of Consciousness: sedated  Airway & Oxygen Therapy: Patient Spontanous Breathing and Patient connected to face mask oxygen  Post-op Assessment: Report given to RN and Post -op Vital signs reviewed and stable  Post vital signs: Reviewed and stable  Last Vitals:  Vitals:   10/10/16 1045 10/10/16 1134  BP: 110/75   Pulse: 87 76  Resp: 18 13  Temp: 36.7 C     Last Pain:  Vitals:   10/10/16 1045  TempSrc: Oral      Patients Stated Pain Goal: 0 (10/10/16 1045)  Complications: No apparent anesthesia complications

## 2016-10-10 NOTE — Anesthesia Procedure Notes (Signed)
Procedure Name: MAC Date/Time: 10/10/2016 11:17 AM Performed by: Lieutenant Diego Pre-anesthesia Checklist: Patient identified, Timeout performed, Emergency Drugs available, Suction available and Patient being monitored Patient Re-evaluated:Patient Re-evaluated prior to inductionOxygen Delivery Method: Simple face mask

## 2016-10-10 NOTE — Anesthesia Preprocedure Evaluation (Signed)
Anesthesia Evaluation  Patient identified by MRN, date of birth, ID band Patient awake    Reviewed: Allergy & Precautions, NPO status , Patient's Chart, lab work & pertinent test results  Airway   TM Distance: >3 FB   Mouth opening: Limited Mouth Opening Comment: mandibular maxillary fixation Dental no notable dental hx. (+) Teeth Intact, Dental Advisory Given   Pulmonary Current Smoker,    Pulmonary exam normal breath sounds clear to auscultation       Cardiovascular Exercise Tolerance: Good negative cardio ROS Normal cardiovascular exam Rhythm:Regular Rate:Normal     Neuro/Psych negative neurological ROS  negative psych ROS   GI/Hepatic negative GI ROS, Neg liver ROS,   Endo/Other  negative endocrine ROS  Renal/GU negative Renal ROS     Musculoskeletal negative musculoskeletal ROS (+)   Abdominal   Peds  Hematology negative hematology ROS (+)   Anesthesia Other Findings Day of surgery medications reviewed with the patient.  Suboxone therapy   Reproductive/Obstetrics                             Anesthesia Physical Anesthesia Plan  ASA: II  Anesthesia Plan: MAC   Post-op Pain Management:    Induction: Intravenous  Airway Management Planned: Nasal Cannula  Additional Equipment:   Intra-op Plan:   Post-operative Plan:   Informed Consent: I have reviewed the patients History and Physical, chart, labs and discussed the procedure including the risks, benefits and alternatives for the proposed anesthesia with the patient or authorized representative who has indicated his/her understanding and acceptance.   Dental advisory given  Plan Discussed with: CRNA and Anesthesiologist  Anesthesia Plan Comments: (Discussed risks/benefits/alternatives to MAC sedation including need for ventilatory support, hypotension, need for conversion to general anesthesia.  All patient questions  answered.  Patient/guardian wishes to proceed.)        Anesthesia Quick Evaluation

## 2016-10-10 NOTE — H&P (Signed)
Cc: Mandible fractures  HPI:  The patient is a 31 year old male who had an accidental fall proximally 6 weeks ago. It resulted in a right mandibular condyle fracture. He was treated with mandibulomaxillary fixation. The patient returns today for removal of his mandibular maxillary fixation hardware. He has no difficulty since his surgery. He self removed the mandibular wire one week ago. The MMF screws are still in place.  Exam: Constitutional: He is oriented to person, place, and time. He appears well-developedand well-nourished.  Head: Normocephalic. Head is without raccoon's eyesand without Battle's sign.  Right Ear: Tympanic membrane, external earand ear canalnormal.  Left Ear: Tympanic membrane, external earand ear canalnormal.   Nose: Nose normal. No nasal septal hematoma.  Mouth/Throat: Oropharynx is clear and moist.  Normal occlusion. MMF screws are all in place. Eyes: Conjunctivae, EOMand lidsare normal. Pupils are equal, round, and reactive to light. Right eye exhibits no discharge. Left eye exhibits no discharge.  Neck: Normal range of motion. Neck supple.  Cardiovascular: Normal rate, regular rhythmand normal heart sounds.  Pulmonary/Chest: Effort normaland breath sounds normal.  Abdominal: Soft. There is no tenderness.  Musculoskeletal: Normal range of motion.   Assessment: The patient is status post mandibulomaxillary fixation to treat his right mandibular condyle fracture. He has no complaint today.  Plan: We will proceed with removal of his MMF screws in the operating room under IV sedation and local anesthesia. The risks, benefits, and details of the procedure are reviewed. Informed consent is obtained.

## 2016-10-10 NOTE — Anesthesia Postprocedure Evaluation (Signed)
Anesthesia Post Note  Patient: [name]  Procedure(s) Performed: Procedure(s) (LRB): MANDIBULAR HARDWARE REMOVAL (N/A)  Patient location during evaluation: PACU Anesthesia Type: MAC Level of consciousness: awake and alert Pain management: pain level controlled Vital Signs Assessment: post-procedure vital signs reviewed and stable Respiratory status: spontaneous breathing, nonlabored ventilation, respiratory function stable and patient connected to nasal cannula oxygen Cardiovascular status: blood pressure returned to baseline and stable Postop Assessment: no signs of nausea or vomiting Anesthetic complications: no       Last Vitals:  Vitals:   10/10/16 1215 10/10/16 1244  BP: 107/77 112/68  Pulse: 69 72  Resp: 14 18  Temp:  36.5 C    Last Pain:  Vitals:   10/10/16 1244  TempSrc: Oral  PainSc: 0-No pain                 Catalina Gravel

## 2016-10-11 ENCOUNTER — Encounter (HOSPITAL_BASED_OUTPATIENT_CLINIC_OR_DEPARTMENT_OTHER): Payer: Self-pay | Admitting: Otolaryngology

## 2017-06-30 IMAGING — CT CT MAXILLOFACIAL W/O CM
3 of 4 series · 15 of 47 positions shown, 18 images · non-contrast
Comparison: None.

CLINICAL DATA: Fall at trampoline part striking mandible. Right
TMJ/mandibular condyle pain.

EXAM:
CT MAXILLOFACIAL WITHOUT CONTRAST
TECHNIQUE: Multidetector CT imaging of the maxillofacial structures was
performed. Multiplanar CT image reconstructions were also generated.
A small metallic BB was placed on the right temple in order to
reliably differentiate right from left.

[Series 2: facial st · axial · 0.36mm/px · z∈[-252,-98]mm · 10 of 91 slices shown, 13 images]
[im 7/91  brain]
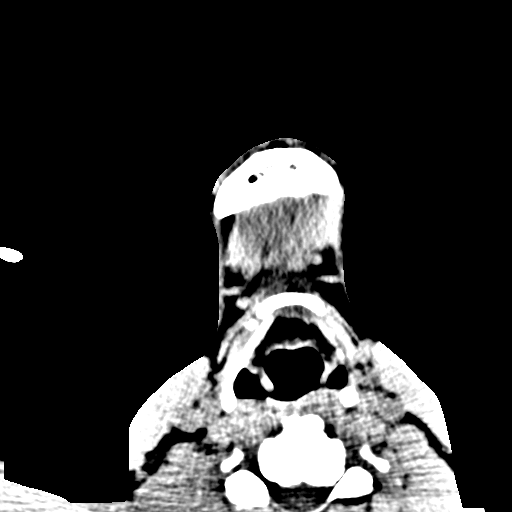
[im 7/91  bone]
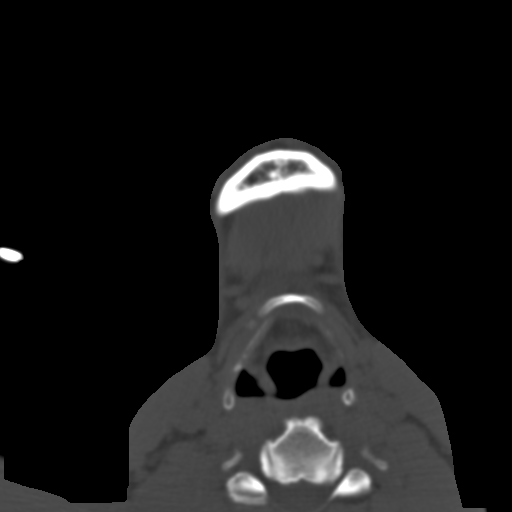
[im 16/91  bone]
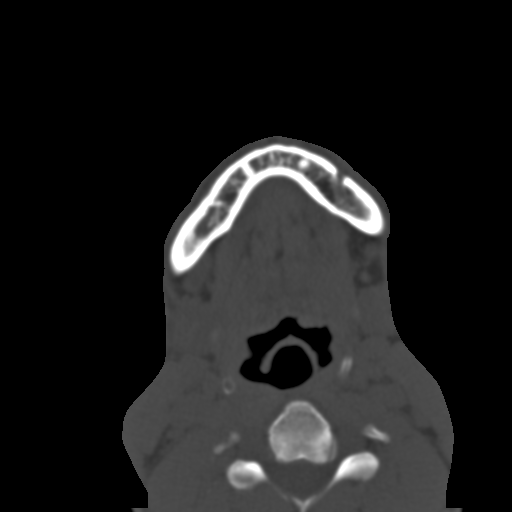
[im 25/91  bone]
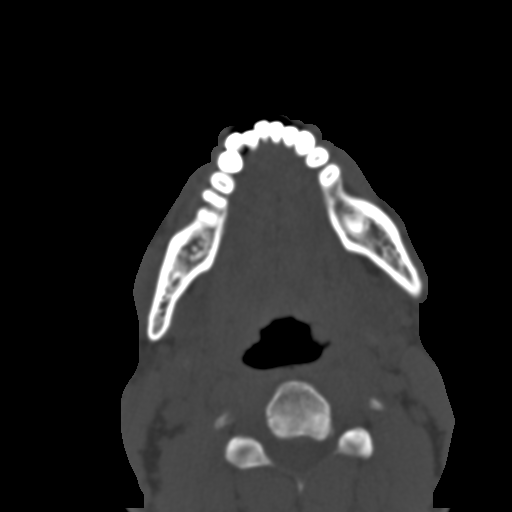
[im 32/91  bone]
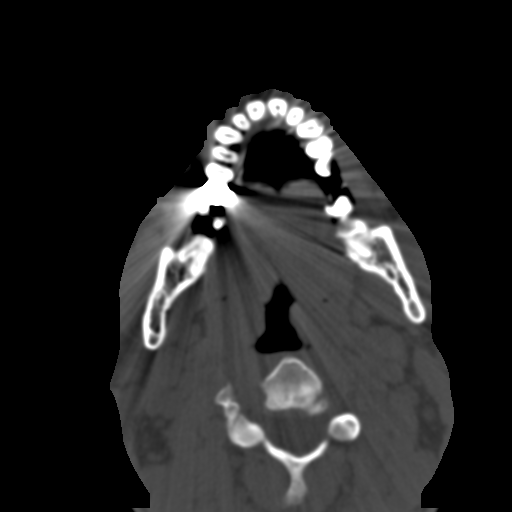
[im 41/91  brain]
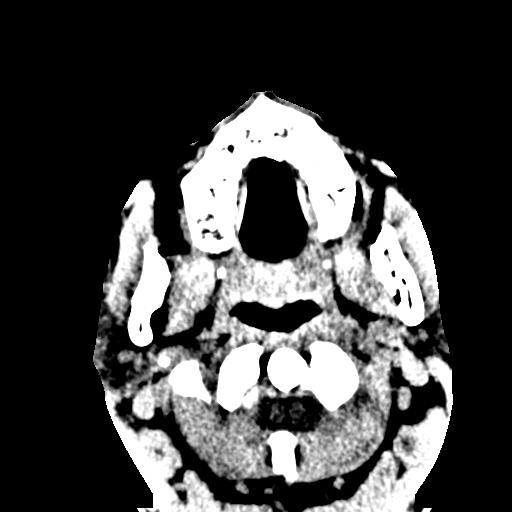
[im 41/91  bone]
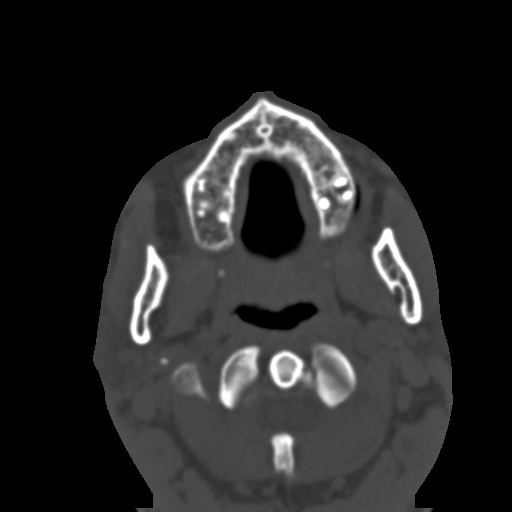
[im 50/91  bone]
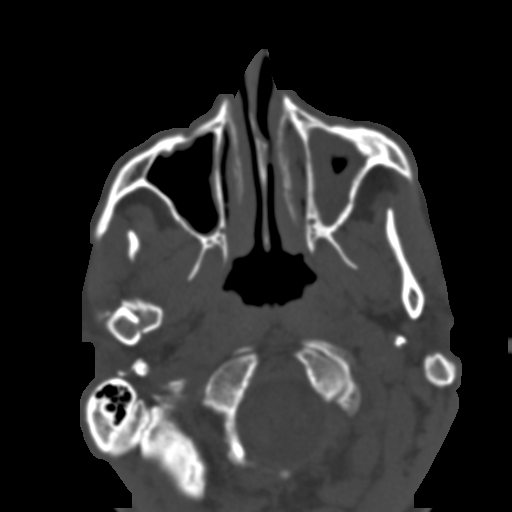
[im 59/91  bone]
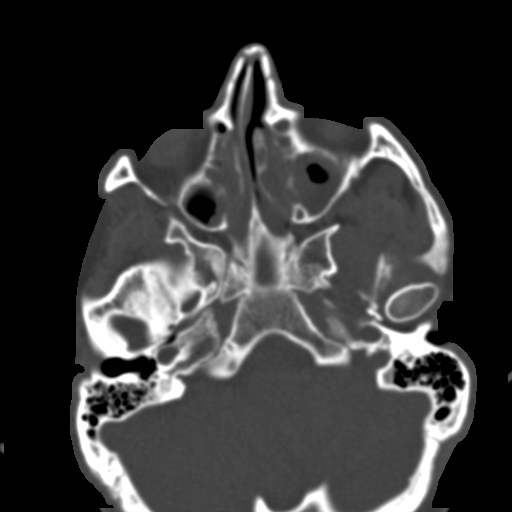
[im 69/91  bone]
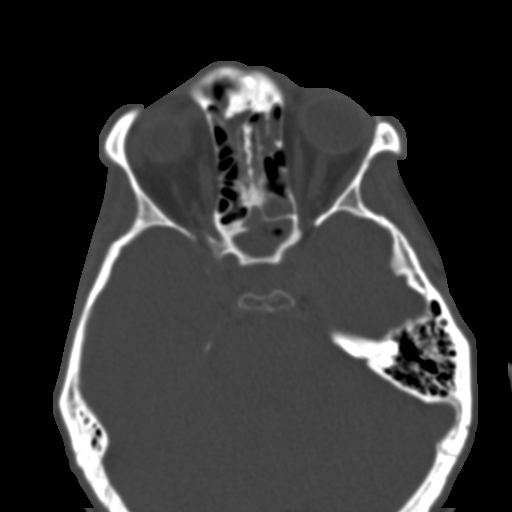
[im 75/91  brain]
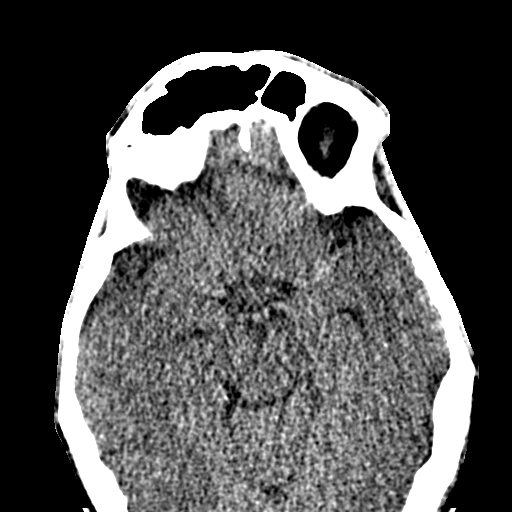
[im 75/91  bone]
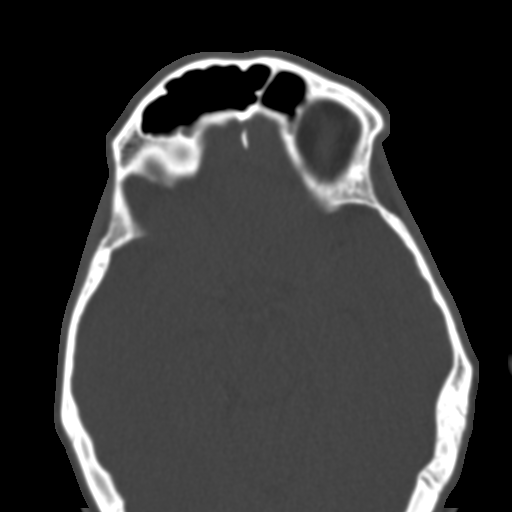
[im 84/91  bone]
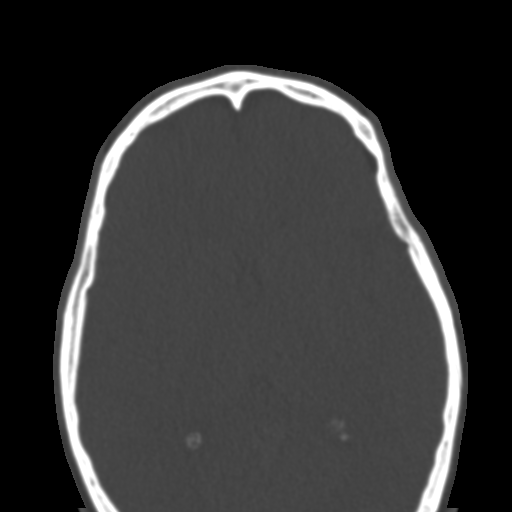

[Series 4: coronal st · coronal · 0.35mm/px · 3 of 76 slices shown]
[im 26/76  bone]
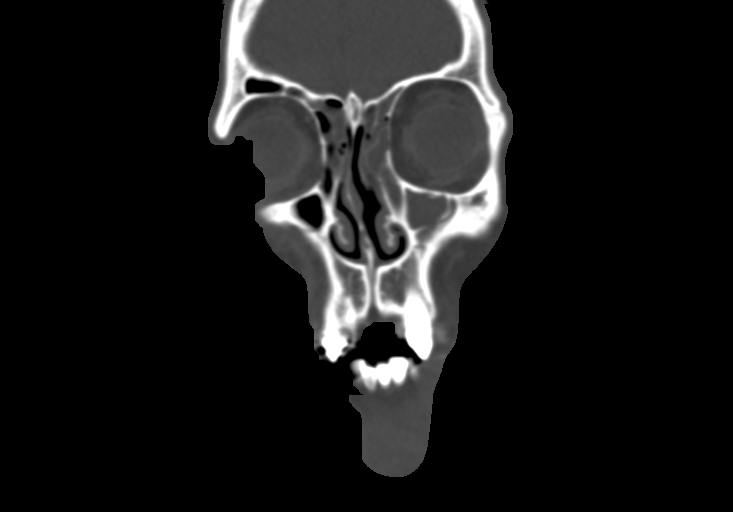
[im 34/76  bone]
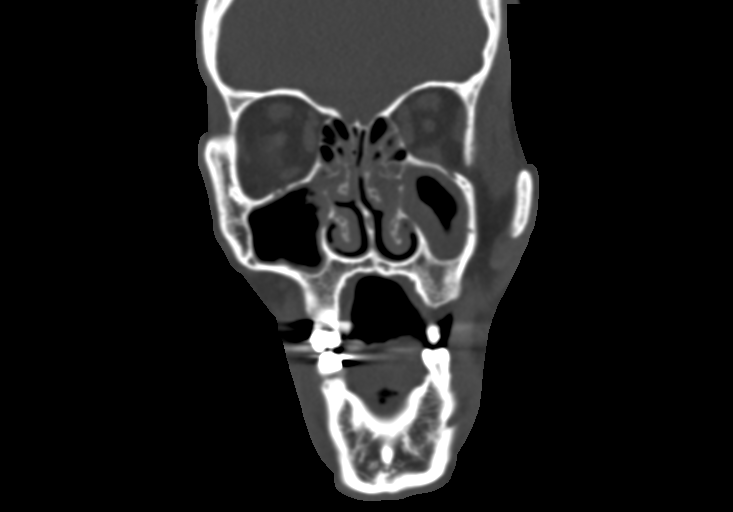
[im 42/76  bone]
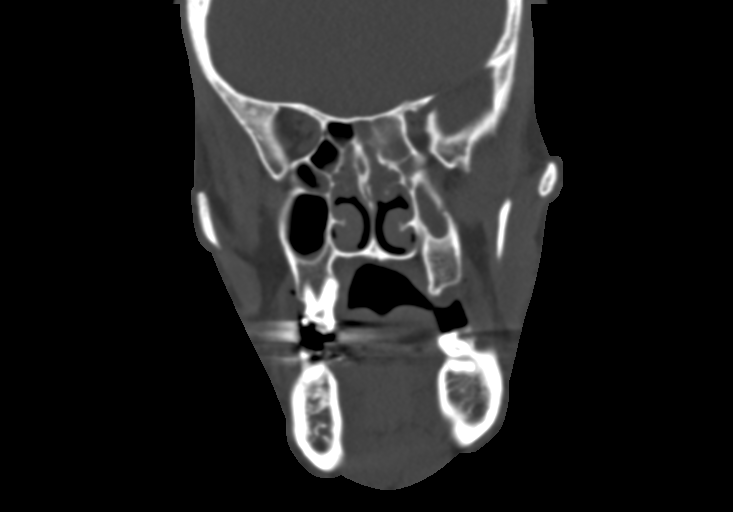

[Series 7: sagittal bone · sagittal · 0.35mm/px · 2 of 76 slices shown]
[im 26/76  bone]
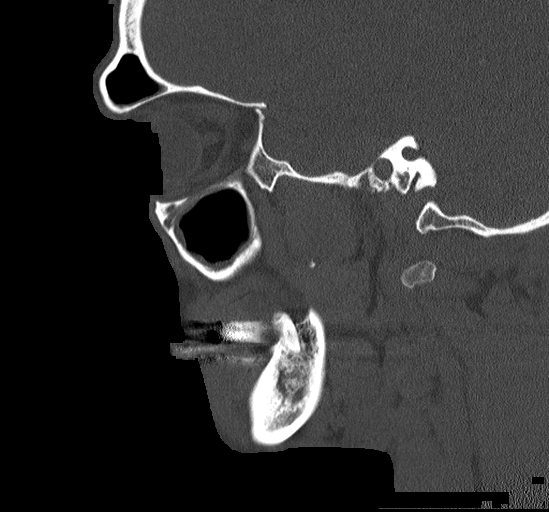
[im 51/76  bone]
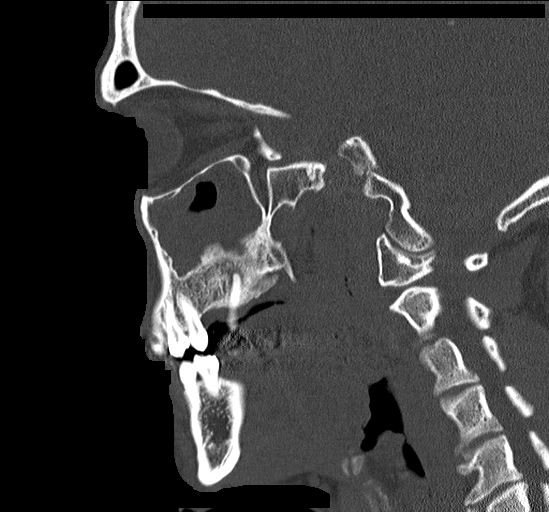

[15 of 47 positions shown; findings below may reference images not displayed]

FINDINGS: Osseous: Examination demonstrates a displaced comminuted fracture of
the right mandibular condyles. No other facial bone fractures are
identified. Multiple dental caries and evidence of mild periodontal
disease.

Orbits: Within normal.

Sinuses: Moderate opacification over the ethmoid, sphenoid and left
maxillary sinus. Minimal mucosal membrane thickening over the right
maxillary sinus and mild opacification of the frontal ethmoidal
recesses bilaterally. No air-fluid levels or mucoperiosteal
thickening. Deviation of the nasal septum to the right. Ostiomeatal
complexes are opacified. Mastoid air cells are clear.

Soft tissues: Within normal.

Limited intracranial: Within normal.
IMPRESSION: Displaced slightly comminuted right mandibular condyle fracture.

Moderate chronic sinus inflammatory disease.

Multiple dental caries and periodontal disease.

## 2019-04-01 DIAGNOSIS — R5382 Chronic fatigue, unspecified: Secondary | ICD-10-CM | POA: Diagnosis not present

## 2019-04-08 DIAGNOSIS — R7989 Other specified abnormal findings of blood chemistry: Secondary | ICD-10-CM | POA: Diagnosis not present

## 2020-07-01 DIAGNOSIS — F19980 Other psychoactive substance use, unspecified with psychoactive substance-induced anxiety disorder: Secondary | ICD-10-CM | POA: Diagnosis not present

## 2020-07-01 DIAGNOSIS — F1920 Other psychoactive substance dependence, uncomplicated: Secondary | ICD-10-CM | POA: Diagnosis not present

## 2020-07-01 DIAGNOSIS — F1120 Opioid dependence, uncomplicated: Secondary | ICD-10-CM | POA: Diagnosis not present

## 2020-07-01 DIAGNOSIS — R7989 Other specified abnormal findings of blood chemistry: Secondary | ICD-10-CM | POA: Diagnosis not present

## 2020-07-08 DIAGNOSIS — F19980 Other psychoactive substance use, unspecified with psychoactive substance-induced anxiety disorder: Secondary | ICD-10-CM | POA: Diagnosis not present

## 2020-07-08 DIAGNOSIS — R7989 Other specified abnormal findings of blood chemistry: Secondary | ICD-10-CM | POA: Diagnosis not present

## 2020-07-08 DIAGNOSIS — F1120 Opioid dependence, uncomplicated: Secondary | ICD-10-CM | POA: Diagnosis not present

## 2020-07-08 DIAGNOSIS — F1920 Other psychoactive substance dependence, uncomplicated: Secondary | ICD-10-CM | POA: Diagnosis not present

## 2020-07-16 DIAGNOSIS — R7989 Other specified abnormal findings of blood chemistry: Secondary | ICD-10-CM | POA: Diagnosis not present

## 2020-07-16 DIAGNOSIS — F1120 Opioid dependence, uncomplicated: Secondary | ICD-10-CM | POA: Diagnosis not present

## 2020-07-16 DIAGNOSIS — F19980 Other psychoactive substance use, unspecified with psychoactive substance-induced anxiety disorder: Secondary | ICD-10-CM | POA: Diagnosis not present

## 2020-07-16 DIAGNOSIS — F1920 Other psychoactive substance dependence, uncomplicated: Secondary | ICD-10-CM | POA: Diagnosis not present

## 2020-08-13 DIAGNOSIS — F1120 Opioid dependence, uncomplicated: Secondary | ICD-10-CM | POA: Diagnosis not present

## 2020-08-13 DIAGNOSIS — F19980 Other psychoactive substance use, unspecified with psychoactive substance-induced anxiety disorder: Secondary | ICD-10-CM | POA: Diagnosis not present

## 2020-08-13 DIAGNOSIS — F1920 Other psychoactive substance dependence, uncomplicated: Secondary | ICD-10-CM | POA: Diagnosis not present

## 2020-08-13 DIAGNOSIS — R7989 Other specified abnormal findings of blood chemistry: Secondary | ICD-10-CM | POA: Diagnosis not present

## 2020-08-20 DIAGNOSIS — F1920 Other psychoactive substance dependence, uncomplicated: Secondary | ICD-10-CM | POA: Diagnosis not present

## 2020-08-20 DIAGNOSIS — R7989 Other specified abnormal findings of blood chemistry: Secondary | ICD-10-CM | POA: Diagnosis not present

## 2020-08-20 DIAGNOSIS — F1120 Opioid dependence, uncomplicated: Secondary | ICD-10-CM | POA: Diagnosis not present

## 2020-08-20 DIAGNOSIS — F19980 Other psychoactive substance use, unspecified with psychoactive substance-induced anxiety disorder: Secondary | ICD-10-CM | POA: Diagnosis not present

## 2020-08-27 DIAGNOSIS — F1120 Opioid dependence, uncomplicated: Secondary | ICD-10-CM | POA: Diagnosis not present

## 2020-08-27 DIAGNOSIS — F19980 Other psychoactive substance use, unspecified with psychoactive substance-induced anxiety disorder: Secondary | ICD-10-CM | POA: Diagnosis not present

## 2020-08-27 DIAGNOSIS — F1920 Other psychoactive substance dependence, uncomplicated: Secondary | ICD-10-CM | POA: Diagnosis not present

## 2020-08-27 DIAGNOSIS — R7989 Other specified abnormal findings of blood chemistry: Secondary | ICD-10-CM | POA: Diagnosis not present

## 2020-09-10 DIAGNOSIS — F1920 Other psychoactive substance dependence, uncomplicated: Secondary | ICD-10-CM | POA: Diagnosis not present

## 2020-09-10 DIAGNOSIS — F19980 Other psychoactive substance use, unspecified with psychoactive substance-induced anxiety disorder: Secondary | ICD-10-CM | POA: Diagnosis not present

## 2020-09-10 DIAGNOSIS — F1120 Opioid dependence, uncomplicated: Secondary | ICD-10-CM | POA: Diagnosis not present

## 2020-09-10 DIAGNOSIS — R7989 Other specified abnormal findings of blood chemistry: Secondary | ICD-10-CM | POA: Diagnosis not present

## 2020-09-24 DIAGNOSIS — F1120 Opioid dependence, uncomplicated: Secondary | ICD-10-CM | POA: Diagnosis not present

## 2020-09-24 DIAGNOSIS — F19980 Other psychoactive substance use, unspecified with psychoactive substance-induced anxiety disorder: Secondary | ICD-10-CM | POA: Diagnosis not present

## 2020-09-24 DIAGNOSIS — R7989 Other specified abnormal findings of blood chemistry: Secondary | ICD-10-CM | POA: Diagnosis not present

## 2020-09-24 DIAGNOSIS — F1920 Other psychoactive substance dependence, uncomplicated: Secondary | ICD-10-CM | POA: Diagnosis not present

## 2020-10-01 DIAGNOSIS — F1120 Opioid dependence, uncomplicated: Secondary | ICD-10-CM | POA: Diagnosis not present

## 2020-10-01 DIAGNOSIS — R7989 Other specified abnormal findings of blood chemistry: Secondary | ICD-10-CM | POA: Diagnosis not present

## 2020-10-01 DIAGNOSIS — F19980 Other psychoactive substance use, unspecified with psychoactive substance-induced anxiety disorder: Secondary | ICD-10-CM | POA: Diagnosis not present

## 2020-10-01 DIAGNOSIS — F1920 Other psychoactive substance dependence, uncomplicated: Secondary | ICD-10-CM | POA: Diagnosis not present

## 2020-10-19 DIAGNOSIS — F1120 Opioid dependence, uncomplicated: Secondary | ICD-10-CM | POA: Diagnosis not present

## 2020-10-19 DIAGNOSIS — F19980 Other psychoactive substance use, unspecified with psychoactive substance-induced anxiety disorder: Secondary | ICD-10-CM | POA: Diagnosis not present

## 2020-10-19 DIAGNOSIS — F1920 Other psychoactive substance dependence, uncomplicated: Secondary | ICD-10-CM | POA: Diagnosis not present

## 2020-10-19 DIAGNOSIS — R7989 Other specified abnormal findings of blood chemistry: Secondary | ICD-10-CM | POA: Diagnosis not present

## 2020-11-05 DIAGNOSIS — F1920 Other psychoactive substance dependence, uncomplicated: Secondary | ICD-10-CM | POA: Diagnosis not present

## 2020-11-05 DIAGNOSIS — F19980 Other psychoactive substance use, unspecified with psychoactive substance-induced anxiety disorder: Secondary | ICD-10-CM | POA: Diagnosis not present

## 2020-11-05 DIAGNOSIS — R7989 Other specified abnormal findings of blood chemistry: Secondary | ICD-10-CM | POA: Diagnosis not present

## 2020-11-05 DIAGNOSIS — F1120 Opioid dependence, uncomplicated: Secondary | ICD-10-CM | POA: Diagnosis not present

## 2020-11-26 DIAGNOSIS — Z6828 Body mass index (BMI) 28.0-28.9, adult: Secondary | ICD-10-CM | POA: Diagnosis not present

## 2020-11-26 DIAGNOSIS — F1920 Other psychoactive substance dependence, uncomplicated: Secondary | ICD-10-CM | POA: Diagnosis not present

## 2020-11-26 DIAGNOSIS — F1120 Opioid dependence, uncomplicated: Secondary | ICD-10-CM | POA: Diagnosis not present

## 2020-11-26 DIAGNOSIS — R7989 Other specified abnormal findings of blood chemistry: Secondary | ICD-10-CM | POA: Diagnosis not present

## 2020-12-10 DIAGNOSIS — R7989 Other specified abnormal findings of blood chemistry: Secondary | ICD-10-CM | POA: Diagnosis not present

## 2020-12-10 DIAGNOSIS — F1920 Other psychoactive substance dependence, uncomplicated: Secondary | ICD-10-CM | POA: Diagnosis not present

## 2020-12-10 DIAGNOSIS — F1120 Opioid dependence, uncomplicated: Secondary | ICD-10-CM | POA: Diagnosis not present

## 2020-12-10 DIAGNOSIS — Z6827 Body mass index (BMI) 27.0-27.9, adult: Secondary | ICD-10-CM | POA: Diagnosis not present

## 2020-12-24 DIAGNOSIS — F1920 Other psychoactive substance dependence, uncomplicated: Secondary | ICD-10-CM | POA: Diagnosis not present

## 2020-12-24 DIAGNOSIS — R7989 Other specified abnormal findings of blood chemistry: Secondary | ICD-10-CM | POA: Diagnosis not present

## 2020-12-24 DIAGNOSIS — F19980 Other psychoactive substance use, unspecified with psychoactive substance-induced anxiety disorder: Secondary | ICD-10-CM | POA: Diagnosis not present

## 2020-12-24 DIAGNOSIS — F1120 Opioid dependence, uncomplicated: Secondary | ICD-10-CM | POA: Diagnosis not present

## 2021-01-07 DIAGNOSIS — F1120 Opioid dependence, uncomplicated: Secondary | ICD-10-CM | POA: Diagnosis not present

## 2021-01-07 DIAGNOSIS — R7989 Other specified abnormal findings of blood chemistry: Secondary | ICD-10-CM | POA: Diagnosis not present

## 2021-01-07 DIAGNOSIS — Z79899 Other long term (current) drug therapy: Secondary | ICD-10-CM | POA: Diagnosis not present

## 2021-01-07 DIAGNOSIS — F19980 Other psychoactive substance use, unspecified with psychoactive substance-induced anxiety disorder: Secondary | ICD-10-CM | POA: Diagnosis not present

## 2021-01-07 DIAGNOSIS — F1920 Other psychoactive substance dependence, uncomplicated: Secondary | ICD-10-CM | POA: Diagnosis not present

## 2021-01-28 DIAGNOSIS — F19980 Other psychoactive substance use, unspecified with psychoactive substance-induced anxiety disorder: Secondary | ICD-10-CM | POA: Diagnosis not present

## 2021-01-28 DIAGNOSIS — R7989 Other specified abnormal findings of blood chemistry: Secondary | ICD-10-CM | POA: Diagnosis not present

## 2021-01-28 DIAGNOSIS — F1920 Other psychoactive substance dependence, uncomplicated: Secondary | ICD-10-CM | POA: Diagnosis not present

## 2021-01-28 DIAGNOSIS — F1120 Opioid dependence, uncomplicated: Secondary | ICD-10-CM | POA: Diagnosis not present
